# Patient Record
Sex: Female | Born: 1955 | Race: White | Hispanic: No | Smoking: Never smoker
Health system: Southern US, Community
[De-identification: ages and names within clinical notes are randomized; demographics above are authoritative.]

## PROBLEM LIST (undated history)

## (undated) DIAGNOSIS — Z9889 Other specified postprocedural states: Secondary | ICD-10-CM

## (undated) DIAGNOSIS — K8020 Calculus of gallbladder without cholecystitis without obstruction: Secondary | ICD-10-CM

## (undated) DIAGNOSIS — E079 Disorder of thyroid, unspecified: Secondary | ICD-10-CM

## (undated) DIAGNOSIS — E039 Hypothyroidism, unspecified: Secondary | ICD-10-CM

## (undated) DIAGNOSIS — R112 Nausea with vomiting, unspecified: Secondary | ICD-10-CM

## (undated) DIAGNOSIS — I10 Essential (primary) hypertension: Secondary | ICD-10-CM

## (undated) HISTORY — PX: FOOT SURGERY: SHX648

## (undated) HISTORY — PX: CHOLECYSTECTOMY: SHX55

## (undated) HISTORY — PX: OTHER SURGICAL HISTORY: SHX169

## (undated) HISTORY — DX: Essential (primary) hypertension: I10

## (undated) HISTORY — DX: Calculus of gallbladder without cholecystitis without obstruction: K80.20

## (undated) HISTORY — DX: Disorder of thyroid, unspecified: E07.9

---

## 1980-04-22 HISTORY — PX: THYROIDECTOMY, PARTIAL: SHX18

## 1998-05-31 ENCOUNTER — Other Ambulatory Visit: Admission: RE | Admit: 1998-05-31 | Discharge: 1998-05-31 | Payer: Self-pay | Admitting: Obstetrics & Gynecology

## 1999-08-13 ENCOUNTER — Other Ambulatory Visit: Admission: RE | Admit: 1999-08-13 | Discharge: 1999-08-13 | Payer: Self-pay | Admitting: Obstetrics & Gynecology

## 2000-12-24 ENCOUNTER — Other Ambulatory Visit: Admission: RE | Admit: 2000-12-24 | Discharge: 2000-12-24 | Payer: Self-pay | Admitting: Obstetrics & Gynecology

## 2002-03-22 ENCOUNTER — Other Ambulatory Visit: Admission: RE | Admit: 2002-03-22 | Discharge: 2002-03-22 | Payer: Self-pay | Admitting: Obstetrics & Gynecology

## 2002-09-17 ENCOUNTER — Ambulatory Visit (HOSPITAL_COMMUNITY): Admission: RE | Admit: 2002-09-17 | Discharge: 2002-09-17 | Payer: Self-pay | Admitting: *Deleted

## 2002-09-17 ENCOUNTER — Encounter: Payer: Self-pay | Admitting: *Deleted

## 2003-04-20 ENCOUNTER — Other Ambulatory Visit: Admission: RE | Admit: 2003-04-20 | Discharge: 2003-04-20 | Payer: Self-pay | Admitting: Obstetrics & Gynecology

## 2004-07-10 ENCOUNTER — Other Ambulatory Visit: Admission: RE | Admit: 2004-07-10 | Discharge: 2004-07-10 | Payer: Self-pay | Admitting: Obstetrics & Gynecology

## 2010-06-19 ENCOUNTER — Encounter (HOSPITAL_COMMUNITY)
Admission: RE | Admit: 2010-06-19 | Discharge: 2010-06-19 | Disposition: A | Discharge status: Home / Self Care | Payer: Managed Care, Other (non HMO) | Source: Ambulatory Visit | Attending: General Surgery | Admitting: General Surgery

## 2010-06-19 ENCOUNTER — Other Ambulatory Visit (HOSPITAL_COMMUNITY): Payer: Self-pay | Admitting: General Surgery

## 2010-06-19 DIAGNOSIS — I10 Essential (primary) hypertension: Secondary | ICD-10-CM

## 2010-06-19 DIAGNOSIS — Z01812 Encounter for preprocedural laboratory examination: Secondary | ICD-10-CM | POA: Insufficient documentation

## 2010-06-19 DIAGNOSIS — Z01818 Encounter for other preprocedural examination: Secondary | ICD-10-CM | POA: Insufficient documentation

## 2010-06-19 DIAGNOSIS — Z01810 Encounter for preprocedural cardiovascular examination: Secondary | ICD-10-CM | POA: Insufficient documentation

## 2010-06-19 LAB — SURGICAL PCR SCREEN: Staphylococcus aureus: NEGATIVE

## 2010-06-20 ENCOUNTER — Other Ambulatory Visit: Payer: Self-pay | Admitting: General Surgery

## 2010-06-20 ENCOUNTER — Observation Stay (HOSPITAL_COMMUNITY)
Admission: RE | Admit: 2010-06-20 | Discharge: 2010-06-21 | Disposition: A | Discharge status: Home / Self Care | Payer: Managed Care, Other (non HMO) | Source: Ambulatory Visit | Attending: General Surgery | Admitting: General Surgery

## 2010-06-20 ENCOUNTER — Observation Stay (HOSPITAL_COMMUNITY): Payer: Managed Care, Other (non HMO)

## 2010-06-20 DIAGNOSIS — K8020 Calculus of gallbladder without cholecystitis without obstruction: Principal | ICD-10-CM | POA: Insufficient documentation

## 2010-06-20 DIAGNOSIS — E039 Hypothyroidism, unspecified: Secondary | ICD-10-CM | POA: Insufficient documentation

## 2010-06-20 DIAGNOSIS — I10 Essential (primary) hypertension: Secondary | ICD-10-CM | POA: Insufficient documentation

## 2010-07-04 NOTE — Op Note (Signed)
Bonnie Johnson, Bonnie Johnson              ACCOUNT NO.:  [id number]  MEDICAL RECORD NO.:  [id number]           PATIENT TYPE:  I  LOCATION:  5151                         FACILITY:  [facility]  PHYSICIAN:  Angelia Mould. Derrell Lolling, M.D.DATE OF BIRTH:  1955-11-05  DATE OF PROCEDURE:  06/20/2010 DATE OF DISCHARGE:  06/21/2010                              OPERATIVE REPORT   PREOPERATIVE DIAGNOSIS:  Chronic cholecystitis with cholelithiasis.  POSTOPERATIVE DIAGNOSIS:  Chronic cholecystitis with cholelithiasis.  OPERATION PERFORMED:  Laparoscopic cholecystectomy with intraoperative cholangiogram.  SURGEON:  Angelia Mould. Derrell Lolling, MD  FIRST ASSISTANT:  Wilmon Arms. Tsuei, MD  OPERATIVE INDICATIONS:  This is a 55 year old Caucasian female who has had right upper quadrant pain for 2 months.  She did not have any GI problems prior to this.  During this period of time, her appetite had diminished.  She had been nauseated, but no vomiting.  She does not know any triggering factors for the pain.  The pain is worse in the evening, not so band in the morning.  She has had a gallbladder ultrasound which shows several large gallstones but no wall thickening.  Common bile duct measured 3 mm and was felt to be normal.  Her liver function test and CBC are normal.  She has been having almost daily pain according to her. She is brought to the operating room electively.  OPERATIVE FINDINGS:  The gallbladder was discolored and somewhat thick walled and contained numerous palpable stones.  The cholangiogram was normal, showing normal intrahepatic and extrahepatic biliary anatomy, no filling defects, and no obstruction with good flow of contrast into the duodenum.  There was some chronic, soft filmy adhesions in the right lower quadrant, they were fairly extensive.  There did not seem to be any purulence or any primary disease process of the small intestine or large intestine.  The liver, stomach and duodenum looked  normal.  OPERATIVE TECHNIQUE:  Following the induction of general endotracheal anesthesia, the patient's abdomen was prepped and draped in a sterile fashion.  Intravenous antibiotics were given.  Surgical time-out was held, identifying correct patient and correct procedure.  Marcaine 0.25% with epinephrine was used as a local infiltration anesthetic.  A vertically oriented incision was made at the lower rim of the umbilicus. The fascia was incised in the midline and the abdominal cavity entered under direct vision.  An 11-mm Hassan trocar was inserted and secured with purse-string suture of 0 Vicryl.  Pneumoperitoneum was created. Video camera was inserted with visualization and findings as described above.  An 11-mm trocar was placed in the subxiphoid region and two 5 mm trocars were placed in the right upper quadrant.  I took down some of the filmy adhesions in the right lateral paracolic gutter to facilitate the trocar insertion.  The fundus of the gallbladder was grabbed and retracted up over the top of the liver.  I took down some adhesions off the infundibulum of the gallbladder and then we could retract the infundibulum laterally.  I dissected out the cystic duct and the cystic artery.  I isolated the anterior branch of the cystic artery as it went on  to the wall of the gallbladder, secured it with multiple metal clips and divided it.  A c Cook catheter was inserted into cystic duct and a cholangiogram was obtained using the C-arm.  The cholangiogram was normal as described above.  The cholangiogram catheter was removed.  Cystic duct was secured with multiple metal clips and divided.  There was a small posterior branch of the cystic artery which was isolated, secured with multiple metal clips and divided.  The gallbladder was dissected from its bed with electrocautery, placed in a specimen bag and removed.  The operative field was irrigated.  Irrigation of fluid was completely  clear.  There was no bleeding, no bile leak whatsoever.  After evacuating all of the irrigation fluid, the trocars were removed under direct vision.  There was no bleeding from the trocar sites.  The pneumoperitoneum was released.  The fascia at the umbilicus was closed with 0 Vicryl sutures. The skin incisions were closed with 4-0 monocril and dermabond. Sponge, needle and instrument counts were correct.     Angelia Mould. Derrell Lolling, M.D.     HMI/MEDQ  D:  07/02/2010  T:  07/03/2010  Job:  [id number]  cc:   Derek Jack  Electronically Signed by Claud Kelp M.D. on 07/04/2010 09:28:47 AM

## 2010-07-12 ENCOUNTER — Other Ambulatory Visit: Payer: Self-pay | Admitting: General Surgery

## 2010-07-12 DIAGNOSIS — R1011 Right upper quadrant pain: Secondary | ICD-10-CM

## 2010-07-16 ENCOUNTER — Ambulatory Visit
Admission: RE | Admit: 2010-07-16 | Discharge: 2010-07-16 | Disposition: A | Discharge status: Home / Self Care | Payer: Managed Care, Other (non HMO) | Source: Ambulatory Visit | Attending: General Surgery | Admitting: General Surgery

## 2010-07-16 ENCOUNTER — Ambulatory Visit: Admission: RE | Admit: 2010-07-16 | Payer: Managed Care, Other (non HMO) | Source: Ambulatory Visit

## 2010-07-16 ENCOUNTER — Other Ambulatory Visit: Payer: Self-pay | Admitting: General Surgery

## 2010-07-16 DIAGNOSIS — R1011 Right upper quadrant pain: Secondary | ICD-10-CM

## 2010-07-16 MED ORDER — IOHEXOL 300 MG/ML  SOLN
100.0000 mL | Freq: Once | INTRAMUSCULAR | Status: AC | PRN
  Administered 2010-07-16: 100 mL via INTRAVENOUS

## 2010-08-29 ENCOUNTER — Encounter (INDEPENDENT_AMBULATORY_CARE_PROVIDER_SITE_OTHER): Payer: Self-pay | Admitting: General Surgery

## 2011-01-10 ENCOUNTER — Encounter (HOSPITAL_BASED_OUTPATIENT_CLINIC_OR_DEPARTMENT_OTHER): Payer: Self-pay

## 2011-01-10 ENCOUNTER — Emergency Department (INDEPENDENT_AMBULATORY_CARE_PROVIDER_SITE_OTHER): Payer: Managed Care, Other (non HMO)

## 2011-01-10 ENCOUNTER — Emergency Department (HOSPITAL_BASED_OUTPATIENT_CLINIC_OR_DEPARTMENT_OTHER)
Admission: EM | Admit: 2011-01-10 | Discharge: 2011-01-10 | Disposition: A | Discharge status: Home / Self Care | Payer: Managed Care, Other (non HMO) | Attending: Emergency Medicine | Admitting: Emergency Medicine

## 2011-01-10 DIAGNOSIS — W268XXA Contact with other sharp object(s), not elsewhere classified, initial encounter: Secondary | ICD-10-CM | POA: Insufficient documentation

## 2011-01-10 DIAGNOSIS — S61215A Laceration without foreign body of left ring finger without damage to nail, initial encounter: Secondary | ICD-10-CM

## 2011-01-10 DIAGNOSIS — S61209A Unspecified open wound of unspecified finger without damage to nail, initial encounter: Secondary | ICD-10-CM | POA: Insufficient documentation

## 2011-01-10 DIAGNOSIS — S56129A Laceration of flexor muscle, fascia and tendon of unspecified finger at forearm level, initial encounter: Secondary | ICD-10-CM

## 2011-01-10 DIAGNOSIS — X58XXXA Exposure to other specified factors, initial encounter: Secondary | ICD-10-CM

## 2011-01-10 DIAGNOSIS — E079 Disorder of thyroid, unspecified: Secondary | ICD-10-CM | POA: Insufficient documentation

## 2011-01-10 DIAGNOSIS — I10 Essential (primary) hypertension: Secondary | ICD-10-CM | POA: Insufficient documentation

## 2011-01-10 MED ORDER — TETANUS-DIPHTH-ACELL PERTUSSIS 5-2-15.5 LF-MCG/0.5 IM SUSP: 0.5000 mL | Freq: Once | INTRAMUSCULAR | Status: DC

## 2011-01-10 MED ORDER — LIDOCAINE-EPINEPHRINE 2 %-1:100000 IJ SOLN
INTRAMUSCULAR | Status: AC
  Administered 2011-01-10: 1 mL
  Filled 2011-01-10: qty 1

## 2011-01-10 MED ORDER — TETANUS-DIPHTH-ACELL PERTUSSIS 5-2.5-18.5 LF-MCG/0.5 IM SUSP
INTRAMUSCULAR | Status: AC
  Administered 2011-01-10: 0.5 mL via INTRAMUSCULAR
  Filled 2011-01-10: qty 0.5

## 2011-01-10 NOTE — ED Notes (Signed)
Laceration to 5th digit.  Injury occurred while opening a dog food can.

## 2011-01-10 NOTE — ED Provider Notes (Signed)
History     CSN: [id number] Arrival date & time: 01/10/2011  8:23 AM   Chief Complaint  Patient presents with  . Laceration     (Include location/radiation/quality/duration/timing/severity/associated sxs/prior treatment) Patient is a 55 y.o. female presenting with skin laceration. The history is provided by the patient.  Laceration  The incident occurred less than 1 hour ago. Pain location: left little and ring finger. The laceration is 3 cm in size. The laceration mechanism was a a metal edge. The pain is moderate. The pain has been constant since onset. She reports no foreign bodies present. Her tetanus status is out of date.     Past Medical History  Diagnosis Date  . Thyroid disease   . Hypertension      Past Surgical History  Procedure Date  . Thyroidectomy, partial 1982  . Foot surgery Prior to 1982    3 surgeries to remove cyst  . Cholecystectomy     Family History  Problem Relation Age of Onset  . Heart disease Father     History  Substance Use Topics  . Smoking status: Never Smoker   . Smokeless tobacco: Never Used  . Alcohol Use: No    OB History    Grav Para Term Preterm Abortions TAB SAB Ect Mult Living                  Review of Systems  All other systems reviewed and are negative.    Allergies  Avelox; Codeine; Lisinopril; Sulfa antibiotics; and Penicillins  Home Medications   Current Outpatient Rx  Name Route Sig Dispense Refill  . CANDESARTAN CILEXETIL 8 MG PO TABS Oral Take 8 mg by mouth daily.      Marland Kitchen FELODIPINE 5 MG PO TB24 Oral Take 5 mg by mouth daily.      Marland Kitchen LEVOTHYROXINE SODIUM 125 MCG PO TABS Oral Take 125 mcg by mouth daily.        Physical Exam    BP 133/68  Pulse 80  Temp(Src) 97.6 F (36.4 C) (Oral)  Resp 16  Ht 5\' 4"  (1.626 m)  Wt 120 lb (54.432 kg)  BMI 20.60 kg/m2  SpO2 100%  Physical Exam  Constitutional: She is oriented to person, place, and time. She appears well-developed and well-nourished.  HENT:    Head: Normocephalic.  Eyes: EOM are normal.  Neck: Normal range of motion.  Pulmonary/Chest: Effort normal.  Musculoskeletal: Normal range of motion.       Superficial laceration over volar surface of proximal phalynx of left ring finger with normal flexor and extensor tendon function as well as neurovascular status.  Laceration over the radial surface of the proximal phalanx of the left little finger which is deep in nature.  There does appear to be a small amount of bleeding from this area.  The patient has no flexor tendon function, but normal extensor tendon function. She has numbness of the radial aspect of the left little finger  Neurological: She is alert and oriented to person, place, and time.  Psychiatric: She has a normal mood and affect.    ED Course  LACERATION REPAIR Date/Time: 01/10/2011 9:16 AM Performed by: Lyanne Co Authorized by: Lyanne Co Consent: Verbal consent obtained. Risks and benefits: risks, benefits and alternatives were discussed Consent given by: patient Patient identity confirmed: verbally with patient Body area: upper extremity Location details: left ring finger Laceration length: 1 cm Foreign bodies: no foreign bodies Tendon involvement: none Nerve involvement: none Vascular  damage: no Anesthesia: local infiltration Local anesthetic: lidocaine 2% without epinephrine Anesthetic total: 1 ml Preparation: Patient was prepped and draped in the usual sterile fashion. Irrigation solution: saline Irrigation method: syringe Amount of cleaning: standard Skin closure: 4-0 Prolene Number of sutures: 2 Technique: simple Approximation: close Approximation difficulty: simple Dressing: gauze roll Patient tolerance: Patient tolerated the procedure well with no immediate complications.  LACERATION REPAIR Date/Time: 01/10/2011 9:17 AM Performed by: Lyanne Co Authorized by: Lyanne Co Consent: Verbal consent obtained. Consent given by:  patient Patient identity confirmed: verbally with patient Body area: upper extremity Location details: left small finger Laceration length: 2 cm Tendon involvement: complex Nerve involvement: complex Vascular damage: yes Local anesthetic: lidocaine 2% without epinephrine Anesthetic total: 3 ml Irrigation solution: saline Irrigation method: syringe Amount of cleaning: standard Skin closure: 4-0 Prolene Number of sutures: 3 Technique: simple Approximation: close Approximation difficulty: simple Dressing: gauze roll Patient tolerance: Patient tolerated the procedure well with no immediate complications.    Results for orders placed during the hospital encounter of 06/19/10  SURGICAL PCR SCREEN      Component Value Range   MRSA, PCR NEGATIVE  NEGATIVE    Staphylococcus aureus    NEGATIVE    Value: NEGATIVE            The Xpert SA Assay (FDA     approved for NASAL specimens     only), is one component of     a comprehensive surveillance     program.  It is not intended     to diagnose infection nor to     guide or monitor treatment.   Dg Hand Complete Left  01/10/2011  *RADIOLOGY REPORT*  Clinical Data: Small finger laceration.  LEFT HAND - COMPLETE 3+ VIEW  Comparison: None.  Findings: There is soft tissue irregularity of the small finger proximally consistent with a laceration.  No foreign body is identified.  There is no evidence of acute fracture or dislocation.  IMPRESSION: Soft tissue laceration without demonstrated foreign body or acute osseous abnormality.  Original Report Authenticated By: Gerrianne Scale, M.D.   I personally reviewed the radiographs from today's visit   MDM Superficial laceration of left ring was repaired primarily at the bedside.  Patient with evidence of flexor tendon injury to her left little finger as well as likely damage to the neurovascular bundle on the radial aspect.  Awaiting callback from hand surgery at this time for further evaluation.   The left little finger wound was closed loosely with 3 Prolene sutures.  Tetanus updated.  9:33 AM Spoke with Dr Ophelia Charter who will see the pt in the office later today and likely repair in the OR tomorrow. Pt informed. Dr Ophelia Charter office will contact the pt today with her appointment details       Lyanne Co, MD 01/10/11 [phone]

## 2011-01-10 NOTE — ED Notes (Signed)
Pt ambulatory to radiology

## 2011-01-10 NOTE — ED Notes (Signed)
Suturing complete and pt tolerated well.  Dressing to affected fingers applied by Mauricio Po, EMT.

## 2011-01-10 NOTE — ED Notes (Signed)
MD at bedside suturing

## 2011-01-11 ENCOUNTER — Encounter (HOSPITAL_COMMUNITY)
Admission: RE | Admit: 2011-01-11 | Discharge: 2011-01-11 | Disposition: A | Discharge status: Home / Self Care | Payer: Managed Care, Other (non HMO) | Source: Ambulatory Visit | Attending: Orthopaedic Surgery | Admitting: Orthopaedic Surgery

## 2011-01-11 LAB — CBC
Platelets: 230 10*3/uL (ref 150–400)
RBC: 4.75 MIL/uL (ref 3.87–5.11)
RDW: 13.3 % (ref 11.5–15.5)
WBC: 8.9 10*3/uL (ref 4.0–10.5)

## 2011-01-11 LAB — BASIC METABOLIC PANEL
Calcium: 9.8 mg/dL (ref 8.4–10.5)
GFR calc Af Amer: >60 mL/min (ref >60)
GFR calc non Af Amer: >60 mL/min (ref >60)
Sodium: 143 mEq/L (ref 135–145)

## 2011-01-11 LAB — PROTIME-INR
INR: 0.97 (ref 0.00–1.49)
Prothrombin Time: 13.1 seconds (ref 11.6–15.2)

## 2011-01-11 LAB — SURGICAL PCR SCREEN
MRSA, PCR: NEGATIVE
Staphylococcus aureus: NEGATIVE

## 2011-01-14 ENCOUNTER — Ambulatory Visit (HOSPITAL_COMMUNITY)
Admission: RE | Admit: 2011-01-14 | Discharge: 2011-01-14 | Disposition: A | Discharge status: Home / Self Care | Payer: Managed Care, Other (non HMO) | Source: Ambulatory Visit | Attending: Orthopaedic Surgery | Admitting: Orthopaedic Surgery

## 2011-01-14 DIAGNOSIS — Y929 Unspecified place or not applicable: Secondary | ICD-10-CM | POA: Insufficient documentation

## 2011-01-14 DIAGNOSIS — Z01812 Encounter for preprocedural laboratory examination: Secondary | ICD-10-CM | POA: Insufficient documentation

## 2011-01-14 DIAGNOSIS — S5420XA Injury of radial nerve at forearm level, unspecified arm, initial encounter: Secondary | ICD-10-CM | POA: Insufficient documentation

## 2011-01-14 DIAGNOSIS — S61209A Unspecified open wound of unspecified finger without damage to nail, initial encounter: Secondary | ICD-10-CM | POA: Insufficient documentation

## 2011-01-14 DIAGNOSIS — X58XXXA Exposure to other specified factors, initial encounter: Secondary | ICD-10-CM | POA: Insufficient documentation

## 2011-01-22 NOTE — Op Note (Signed)
NAMEZAMYAH, Bonnie Johnson              ACCOUNT NO.:  [id number]  MEDICAL RECORD NO.:  [id number]  LOCATION:                                 FACILITY:  PHYSICIAN:  Mark C. Ophelia Charter, M.D.    DATE OF BIRTH:  08-20-1955  DATE OF PROCEDURE:  01/14/2011 DATE OF DISCHARGE:                              OPERATIVE REPORT   PREOPERATIVE DIAGNOSIS:  Left hand 44-day-old laceration closed with small finger flexor digitorum profundus zone 2 injury and lacerated radial digital nerve.  POSTOPERATIVE DIAGNOSIS:  Left hand 13-day-old laceration closed with small finger flexor digitorum profundus zone 2 injury and lacerated radial digital nerve.  PROCEDURE:  Expiration, right fifth finger laceration repair profundus tendon and microscopic radial digital nerve repair.  SURGEON:  Mark C. Ophelia Charter, MD  ANESTHESIA:  General plus ulnar nerve block at the conclusion of the case at the elbow, 5 mL Marcaine.  TOURNIQUET TIME:  An hour and 30 minutes.  ESTIMATED BLOOD LOSS:  Minimal.  After induction of general anesthesia, preoperative Ancef prophylaxis, standard prepping and draping.  The usual extremity sheets drapes were applied.  Time-out procedure was completed.  The patient had a smiley- faced laceration along the palmar aspect of the base of the ring finger that extended to the radial aspect.  Sutures removed.  Copious irrigation was performed.  Incision was extended proximally and distally, with this exact incision that extended to the distal palmar crease since the flexor tendon was lacerated and had pulled back. Radial digital nerve was torn.  Digital artery had been cut, but did thrombose and was not bleeding.  Flexor sheath was inspected once the slip of the sublimis was intact which was the ulnar submersed in the radial slip of the sublimis had a complete laceration.  DIP joint was flexed, 4-0 Ethibond suture was placed in the Kessler modification of a Tajima suture using a double-armed needle.   Initially, a figure-of-eight suture was placed in the slip of the sublimis, however, the incision was extended proximally, the profundus was found proximal to A1 pulley, A1 pulley was divided and suture placed in the proximal portion of the tendon using the tendon passer was passed through the sheath, however, with the tendon running through the sheath it was tight and single slip of the sublimis had been repaired, it was taken down taken to suture out.  Two ends of the suture for the profundus were tied down, suture tied down on one end, attention was held by Maud Deed, PA, who was present for assessing the surgical case and was medically necessary for assistance in the procedure.  Tendon was tied down and then a running 6- 0 nylon suture was run across the volar aspect of the tendon repair to smooth the repair.  Next, the operative microscope was draped and brought in.  Using an 8-0 nylon, three sutures were placed and time spent repairing the ulnar digital nerve at the neural repair.  Wrist was flexed and extended passively and there was a normal cascade of the digits.  Suture skin flaps had been placed with 4-0 nylon were then removed.  Tourniquet was deflated.  Bipolar was used for appropriate skin bleeding edges.  A 4-0 nylon was used for interrupted skin closure.  Xeroform, 4x4s, twiners and the flexor tendon splint with the wrist flexed, MCPs flexed, and PIPs, DIPs with fluffs in the palm were then applied.  At the conclusion of the case, since the regional cut had been a C-shaped cut with a distally based flap, rather than putting local in the skin, it was elected to perform ulnar nerve block at the elbow.  It was flexed up to 90 degrees, palpation of the medial epicondyle and olecranon and then using a 25-needle 5 mL of Marcaine was infiltrated into the ulnar groove and the cubital tunnel for the nerve block and massaged.  After old fluffs had been applied to the hand,  4x4s, Webril dorsal plaster splint and then incorporation of the volar, but no plaster in the palm or fingers.  The patient tolerated the procedure well, was transferred to the recovery room in stable condition.  Instrument count and needle count was correct.  There was good capillary refill to the digit.     Mark C. Ophelia Charter, M.D.     MCY/MEDQ  D:  01/14/2011  T:  01/15/2011  Job:  [id number]  Electronically Signed by Annell Greening M.D. on 01/22/2011 05:23:12 PM

## 2011-10-02 ENCOUNTER — Encounter (HOSPITAL_BASED_OUTPATIENT_CLINIC_OR_DEPARTMENT_OTHER)
Admission: RE | Admit: 2011-10-02 | Discharge: 2011-10-02 | Disposition: A | Discharge status: Home / Self Care | Payer: BC Managed Care – PPO | Source: Ambulatory Visit | Attending: Orthopedic Surgery | Admitting: Orthopedic Surgery

## 2011-10-02 ENCOUNTER — Encounter (HOSPITAL_BASED_OUTPATIENT_CLINIC_OR_DEPARTMENT_OTHER): Payer: Self-pay | Admitting: *Deleted

## 2011-10-02 LAB — BASIC METABOLIC PANEL
CO2: 28 mEq/L (ref 19–32)
Calcium: 8.9 mg/dL (ref 8.4–10.5)
Creatinine, Ser: 0.62 mg/dL (ref 0.50–1.10)
GFR calc Af Amer: >90 mL/min (ref >90)
GFR calc non Af Amer: >90 mL/min (ref >90)

## 2011-10-02 NOTE — Progress Notes (Signed)
Bring all medications and pack an overnight bag in case neighbor's cannot take care of her. Plans to come today for BMET and EKG.

## 2011-10-03 ENCOUNTER — Other Ambulatory Visit: Payer: Self-pay | Admitting: Orthopedic Surgery

## 2011-10-07 NOTE — H&P (Signed)
Bonnie Johnson is an 56 y.o. female.   Chief Complaint: c/o continued pain and stiffness digits of her left hand HPI: .  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  While opening up a dog food can with an electric can opener on January 10, 2011 she sustained a deep laceration to the P-1 segment of her left small finger and a superficial laceration to her left ring finger.  She was seen at [facility] where she was noted to have laceration of the radial proper digital nerve and artery of the small finger, partial laceration of the flexor digitorum superficialis.  On January 14, 2011 she underwent exploration of her small finger laceration with repair of the radial proper digital nerve and repair of her profundus tendon zone 2.  She underwent extensive hand rehab at [facility] and has developed flexion to bring her fingertip to the palm, but has a progressive flexion contracture developing at the ring finger MP joint and at the small finger MP and PIP joint.  She has a thick scar on the palmar surface of her small finger and marked sensitivity over her nerve repair site.   Past Medical History  Diagnosis Date  . Thyroid disease   . Hypertension   . Hypothyroidism   . PONV (postoperative nausea and vomiting)     Past Surgical History  Procedure Date  . Thyroidectomy, partial 1982  . Foot surgery Prior to 1982    3 surgeries to remove cyst  . Surgery on left hand     sept 24th 2012, severe pinky finger on can lid  . Cholecystectomy     2012    Family History  Problem Relation Age of Onset  . Heart disease Father    Social History:  reports that she has never smoked. She has never used smokeless tobacco. She reports that she drinks alcohol. She reports that she does not use illicit drugs.  Allergies:  Allergies  Allergen Reactions  . Avelox (Moxifloxacin Hcl In Nacl)   . Codeine   .  Doxycycline Hives  . Lisinopril   . Sulfa Antibiotics   . Penicillins Rash    No prescriptions prior to admission    No results found for this or any previous visit (from the past 48 hour(s)).  No results found.   Pertinent items are noted in HPI.  Height 4' (1.219 m), weight 54.432 kg (120 lb).  General appearance: alert Head: Normocephalic, without obvious abnormality Neck: supple, symmetrical, trachea midline Resp: clear to auscultation bilaterally Cardio: regular rate and rhythm GI: normal findings: aorta normal Extremities:.  Inspection of her hand reveals a well healed surgical scar. 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 She has a significant nodule of Dupuytren's fibromatosis forming over the P-1 segment of her small finger which is contributing to her PIP flexion contracture. She has pull through of her flexor profundus repair primarily leading to flexion at the PIP joint.  She has minimal pull through at the DIP joint.  She has a positive Tinel's sign at her repair site and diminished sensibility in the radial pulp of her small finger, normal sensibility on the ulnar pulp of the small finger.  She has a mildly hypertrophic scar at her ring finger overlying  the P-1 segment radial aspect of her scar and normal function of her profundus superficialis tendons of the ring finger.   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  Bonnie Johnson has been advised as to the extreme risk involved in a re exploration of a traumatic finger injury where the digital  artery was not repaired specifically the dominant radial proper digital artery of the small finger.  With Dupuytrens superimposed upon trauma scarring, there is a very real risk of finger loss.  Indeed, primary amputation of this finger has been discussed and considered.  We proceed on a best effort basis fully aware of the extreme risk in this procedure.   Plan: To the OR for excision of palmar fascia with extensive tenolysis of flexors left hand. The procedure, risks and post-op course were discussed with the patient at length and she was in agreement with the plan.  DASNOIT,Almas Rake J 10/07/2011, 1:49 PM

## 2011-10-08 ENCOUNTER — Encounter (HOSPITAL_BASED_OUTPATIENT_CLINIC_OR_DEPARTMENT_OTHER): Payer: Self-pay | Admitting: Anesthesiology

## 2011-10-08 ENCOUNTER — Ambulatory Visit (HOSPITAL_BASED_OUTPATIENT_CLINIC_OR_DEPARTMENT_OTHER): Payer: BC Managed Care – PPO | Admitting: Anesthesiology

## 2011-10-08 ENCOUNTER — Encounter (HOSPITAL_BASED_OUTPATIENT_CLINIC_OR_DEPARTMENT_OTHER)
Admission: RE | Disposition: A | Discharge status: Home / Self Care | Payer: Self-pay | Source: Ambulatory Visit | Attending: Orthopedic Surgery

## 2011-10-08 ENCOUNTER — Ambulatory Visit (HOSPITAL_BASED_OUTPATIENT_CLINIC_OR_DEPARTMENT_OTHER)
Admission: RE | Admit: 2011-10-08 | Discharge: 2011-10-08 | Disposition: A | Discharge status: Home / Self Care | Payer: BC Managed Care – PPO | Source: Ambulatory Visit | Attending: Orthopedic Surgery | Admitting: Orthopedic Surgery

## 2011-10-08 DIAGNOSIS — X58XXXS Exposure to other specified factors, sequela: Secondary | ICD-10-CM | POA: Insufficient documentation

## 2011-10-08 DIAGNOSIS — IMO0002 Reserved for concepts with insufficient information to code with codable children: Secondary | ICD-10-CM | POA: Insufficient documentation

## 2011-10-08 DIAGNOSIS — I10 Essential (primary) hypertension: Secondary | ICD-10-CM | POA: Insufficient documentation

## 2011-10-08 DIAGNOSIS — M720 Palmar fascial fibromatosis [Dupuytren]: Secondary | ICD-10-CM | POA: Insufficient documentation

## 2011-10-08 DIAGNOSIS — M24549 Contracture, unspecified hand: Secondary | ICD-10-CM | POA: Insufficient documentation

## 2011-10-08 DIAGNOSIS — Z01810 Encounter for preprocedural cardiovascular examination: Secondary | ICD-10-CM | POA: Insufficient documentation

## 2011-10-08 DIAGNOSIS — E039 Hypothyroidism, unspecified: Secondary | ICD-10-CM | POA: Insufficient documentation

## 2011-10-08 HISTORY — PX: DUPUYTREN CONTRACTURE RELEASE: SHX1478

## 2011-10-08 HISTORY — DX: Hypothyroidism, unspecified: E03.9

## 2011-10-08 HISTORY — DX: Other specified postprocedural states: Z98.890

## 2011-10-08 HISTORY — DX: Nausea with vomiting, unspecified: R11.2

## 2011-10-08 SURGERY — RELEASE, DUPUYTREN CONTRACTURE
Anesthesia: General | Site: Hand | Laterality: Left | Wound class: Clean

## 2011-10-08 MED ORDER — ONDANSETRON HCL 4 MG/2ML IJ SOLN
INTRAMUSCULAR | Status: DC | PRN
  Administered 2011-10-08: 4 mg via INTRAVENOUS

## 2011-10-08 MED ORDER — FENTANYL CITRATE 0.05 MG/ML IJ SOLN
INTRAMUSCULAR | Status: DC | PRN
  Administered 2011-10-08 (×3): 25 ug via INTRAVENOUS

## 2011-10-08 MED ORDER — PROPOFOL 10 MG/ML IV EMUL
INTRAVENOUS | Status: DC | PRN
  Administered 2011-10-08: 150 mg via INTRAVENOUS

## 2011-10-08 MED ORDER — DEXAMETHASONE SODIUM PHOSPHATE 4 MG/ML IJ SOLN
INTRAMUSCULAR | Status: DC | PRN
  Administered 2011-10-08: 10 mg via INTRAVENOUS

## 2011-10-08 MED ORDER — HYDROMORPHONE HCL 2 MG PO TABS: ORAL_TABLET | ORAL | Status: AC

## 2011-10-08 MED ORDER — LIDOCAINE HCL 2 % IJ SOLN
INTRAMUSCULAR | Status: DC | PRN
  Administered 2011-10-08: 3 mL

## 2011-10-08 MED ORDER — CHLORHEXIDINE GLUCONATE 4 % EX LIQD: 60.0000 mL | Freq: Once | CUTANEOUS | Status: DC

## 2011-10-08 MED ORDER — LIDOCAINE HCL (CARDIAC) 20 MG/ML IV SOLN
INTRAVENOUS | Status: DC | PRN
  Administered 2011-10-08: 100 mg via INTRAVENOUS

## 2011-10-08 MED ORDER — FENTANYL CITRATE 0.05 MG/ML IJ SOLN
25.0000 ug | INTRAMUSCULAR | Status: DC | PRN
  Administered 2011-10-08: 50 ug via INTRAVENOUS

## 2011-10-08 MED ORDER — ONDANSETRON HCL 4 MG/2ML IJ SOLN
4.0000 mg | Freq: Four times a day (QID) | INTRAMUSCULAR | Status: AC | PRN
  Administered 2011-10-08: 4 mg via INTRAVENOUS

## 2011-10-08 MED ORDER — VANCOMYCIN HCL IN DEXTROSE 1-5 GM/200ML-% IV SOLN
1000.0000 mg | INTRAVENOUS | Status: AC
  Administered 2011-10-08: 1000 mg via INTRAVENOUS

## 2011-10-08 MED ORDER — DROPERIDOL 2.5 MG/ML IJ SOLN
0.6250 mg | Freq: Once | INTRAMUSCULAR | Status: AC
  Administered 2011-10-08: 0.625 mg via INTRAVENOUS

## 2011-10-08 MED ORDER — PROMETHAZINE HCL 25 MG/ML IJ SOLN
6.2500 mg | Freq: Once | INTRAMUSCULAR | Status: AC
  Administered 2011-10-08: 6.25 mg via INTRAVENOUS

## 2011-10-08 MED ORDER — LACTATED RINGERS IV SOLN
INTRAVENOUS | Status: DC
  Administered 2011-10-08: 20 mL/h via INTRAVENOUS
  Administered 2011-10-08 (×2): via INTRAVENOUS

## 2011-10-08 MED ORDER — DROPERIDOL 2.5 MG/ML IJ SOLN: 0.6250 mg | Freq: Once | INTRAMUSCULAR | Status: DC

## 2011-10-08 MED ORDER — SCOPOLAMINE 1 MG/3DAYS TD PT72
1.0000 | MEDICATED_PATCH | TRANSDERMAL | Status: DC
  Administered 2011-10-08: 1.5 mg via TRANSDERMAL

## 2011-10-08 MED ORDER — IBUPROFEN 600 MG PO TABS: 600.0000 mg | ORAL_TABLET | Freq: Four times a day (QID) | ORAL | Status: AC | PRN

## 2011-10-08 MED ORDER — DROPERIDOL 2.5 MG/ML IJ SOLN: 0.6250 mg | Freq: Once | INTRAMUSCULAR | Status: DC | PRN

## 2011-10-08 SURGICAL SUPPLY — 52 items
BANDAGE ADHESIVE 1X3 (GAUZE/BANDAGES/DRESSINGS) IMPLANT
BANDAGE CONFORM 3  STR LF (GAUZE/BANDAGES/DRESSINGS) IMPLANT
BANDAGE ELASTIC 3 VELCRO ST LF (GAUZE/BANDAGES/DRESSINGS) ×2 IMPLANT
BANDAGE GAUZE ELAST BULKY 4 IN (GAUZE/BANDAGES/DRESSINGS) ×4 IMPLANT
BLADE MINI RND TIP GREEN BEAV (BLADE) IMPLANT
BLADE SURG 15 STRL LF DISP TIS (BLADE) ×1 IMPLANT
BLADE SURG 15 STRL SS (BLADE) ×2
BNDG CMPR 9X4 STRL LF SNTH (GAUZE/BANDAGES/DRESSINGS) ×1
BNDG ESMARK 4X9 LF (GAUZE/BANDAGES/DRESSINGS) ×1 IMPLANT
BRUSH SCRUB EZ PLAIN DRY (MISCELLANEOUS) ×3 IMPLANT
CLOTH BEACON ORANGE TIMEOUT ST (SAFETY) ×2 IMPLANT
CORDS BIPOLAR (ELECTRODE) ×2 IMPLANT
COVER MAYO STAND STRL (DRAPES) ×2 IMPLANT
COVER TABLE BACK 60X90 (DRAPES) ×2 IMPLANT
CUFF TOURNIQUET SINGLE 18IN (TOURNIQUET CUFF) ×1 IMPLANT
DECANTER SPIKE VIAL GLASS SM (MISCELLANEOUS) IMPLANT
DRAPE EXTREMITY T 121X128X90 (DRAPE) ×2 IMPLANT
DRAPE SURG 17X23 STRL (DRAPES) ×2 IMPLANT
DRSG EMULSION OIL 3X3 NADH (GAUZE/BANDAGES/DRESSINGS) ×3 IMPLANT
GLOVE BIO SURGEON STRL SZ 6.5 (GLOVE) ×1 IMPLANT
GLOVE BIO SURGEON STRL SZ7 (GLOVE) ×3 IMPLANT
GLOVE BIOGEL M STRL SZ7.5 (GLOVE) ×2 IMPLANT
GLOVE INDICATOR 7.0 STRL GRN (GLOVE) ×2 IMPLANT
GLOVE ORTHO TXT STRL SZ7.5 (GLOVE) ×2 IMPLANT
GOWN PREVENTION PLUS XLARGE (GOWN DISPOSABLE) ×2 IMPLANT
GOWN PREVENTION PLUS XXLARGE (GOWN DISPOSABLE) ×4 IMPLANT
LOOP VESSEL MAXI BLUE (MISCELLANEOUS) ×1 IMPLANT
NDL HYPO 25X1 1.5 SAFETY (NEEDLE) IMPLANT
NEEDLE 27GAX1X1/2 (NEEDLE) ×1 IMPLANT
NEEDLE HYPO 25X1 1.5 SAFETY (NEEDLE) IMPLANT
NS IRRIG 1000ML POUR BTL (IV SOLUTION) ×2 IMPLANT
PACK BASIN DAY SURGERY FS (CUSTOM PROCEDURE TRAY) ×2 IMPLANT
PAD CAST 3X4 CTTN HI CHSV (CAST SUPPLIES) ×1 IMPLANT
PADDING CAST ABS 4INX4YD NS (CAST SUPPLIES)
PADDING CAST ABS COTTON 4X4 ST (CAST SUPPLIES) ×1 IMPLANT
PADDING CAST COTTON 3X4 STRL (CAST SUPPLIES) ×2
SPLINT PLASTER CAST XFAST 3X15 (CAST SUPPLIES) ×4 IMPLANT
SPLINT PLASTER XTRA FASTSET 3X (CAST SUPPLIES) ×4
SPONGE GAUZE 4X4 12PLY (GAUZE/BANDAGES/DRESSINGS) ×2 IMPLANT
STOCKINETTE 4X48 STRL (DRAPES) ×2 IMPLANT
STRIP CLOSURE SKIN 1/2X4 (GAUZE/BANDAGES/DRESSINGS) IMPLANT
SUT ETHILON 5 0 P 3 18 (SUTURE) ×1
SUT NYLON ETHILON 5-0 P-3 1X18 (SUTURE) ×2 IMPLANT
SUT SILK 4 0 PS 2 (SUTURE) ×2 IMPLANT
SUT VIC AB 4-0 P2 18 (SUTURE) IMPLANT
SYR 3ML 23GX1 SAFETY (SYRINGE) IMPLANT
SYR BULB 3OZ (MISCELLANEOUS) ×1 IMPLANT
SYR CONTROL 10ML LL (SYRINGE) ×1 IMPLANT
TOWEL OR 17X24 6PK STRL BLUE (TOWEL DISPOSABLE) ×4 IMPLANT
TRAY DSU PREP LF (CUSTOM PROCEDURE TRAY) ×3 IMPLANT
UNDERPAD 30X30 INCONTINENT (UNDERPADS AND DIAPERS) ×2 IMPLANT
WATER STERILE IRR 1000ML POUR (IV SOLUTION) ×2 IMPLANT

## 2011-10-08 NOTE — Op Note (Signed)
[name], [name]              ACCOUNT NO.:  [id number]  MEDICAL RECORD NO.:  [id number]  LOCATION:                                 FACILITY:  PHYSICIAN:  Bonnie Fitch. Jaxtin Raimondo, M.D.      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  OPERATION: 1. Neurolysis and identification of ulnar proper digital artery and     nerve. 2. 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 3. Tenolysis of flexor digitorum profundus tendon. 4. Tenolysis of flexor digitorum superficialis tendon. 5. Neurolysis of radial proper digital nerve with confirmation of     absence of radial proper digital artery.  OPERATING SURGEON:  Bonnie Fitch. Courtnie Brenes, MD  ASSISTANT:  Marveen Reeks. Dasnoit, PA-C.  ANESTHESIA:  General by LMA supplemented by a postoperative 2% lidocaine digital block.  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  On January 10, 2011, she sustained deep laceration to P1 segment of her left small finger and a superficial laceration to her left ring finger.  She was seen at [facility] and underwent operative treatment by Dr. Annell Greening with repair of the flexor digitorum profundus tendon zone 2 at the level of the distal margin of the A2 pulley and repair of the radial proper digital nerve.  The radial proper digital artery was not repaired.  In the early postoperative period, she began to develop a flexion contracture of her PIP joint and within a few months progressed to nearly 90 degree flexion contracture of the PIP joint.  She was unable to further flex the finger despite extensive therapy by hand rehabilitation specialist.  On Aug 26, 2011, she was referred for upper extremity orthopedic second opinion.  At that time, we reviewed her operative records in detail.  We identified the fact that she did not have radial proper digital artery to the small finger and may have a partially ischemic finger.  She had marked impairment of the sensibility of the radial pulp of the small finger.  She had intact sensibility to the ulnar pulp.  She appeared to have on Allen's testing flow through the ulnar proper digital artery.  We had detailed  informed consent with Bonnie Johnson explaining how complex this predicament was.  She appeared to have a flexion contracture at the PIP and DIP joints due to activation of palmar fibromatosis following a trauma.  There are also issues with whether or not she has a robust blood supply to the finger after losing the radial proper digital artery.  In addition, she may have bowstringing due to incompetence of the flexor retinaculum, specifically the distal portion of the A2 pulley, C1 pulley, the A3 pulley, and possibly the C2 pulley.  With the degree of  flexion contracture at the time of our initial consult, it was not possible to ascertain the integrity of her flexor sheath.  We had a detailed informed consent during which I explained that several outcomes could occur with attempt to correct this finger.  She is in great jeopardy to have an ischemic finger and could have vascular spasm in the postop period leading to gangrene and loss of the finger.  In addition, it is entirely possible that she will develop posttraumatic palmar fibromatosis and have recurrent flexion contracture of the PIP and DIP joints and may have incompetence of the flexor sheath that will make recovery of functional motion very challenging.  We discussed alternative treatments which would include arthrodesis of the proximal interphalangeal joint to salvage the finger versus shortening or deletion of the finger should she have ischemia or a severe flexion contracture recur.  She voiced a complete understanding of these issues.  She now presents for operative care.  In the holding area, we had a second informed consent at which time we reminded her with a friend present that all of these issues were still active and noted that her flexion contracture at the PIP joint had advanced to more than 90 degrees and at the DIP joint approximately 45 degrees.  After informed consent, she is brought to the operating room at this time.  PROCEDURE:  Bonnie Johnson was brought to room #2 of the [facility] and placed in supine position upon the operating table.  Following anesthesia consultation by Dr. Chaney Malling, general anesthesia by LMA technique was recommended and accepted.  Bonnie Johnson has multiple drug allergies.  After carefully starting them, we determined that she was a proper candidate for prophylactic IV vancomycin.  One gram was provided in the holding area.  This would be infused over 90 minutes.  In room #2 under Dr. Seward Meth strict  supervision, general anesthesia by LMA technique was induced followed by routine Betadine scrub and paint of the left upper extremity.  A pneumatic tourniquet was applied to proximal left brachium. Following exsanguination of left arm with Esmarch bandage, arterial tourniquet inflated to 220 mmHg.  Procedure commenced with routine surgical time-out followed by meticulous resection of the prior surgical scar and extension proximally with a Brunner zig zag incision exposing proximal to the A1 pulley.  Abundant scar was identified in the pretendinous fibers as well as a large amount of scar at the natatory ligaments at the web to the ring finger.  With great care, we meticulously dissected the ulnar proper digital artery and nerve from the level of metacarpal head distally releasing Grayson's ligaments and preserving Cleland's ligaments.  There was a thick band of pathologic fascia extending from the abductor digiti minimi that was carefully resected after isolation of the neurovascular structures.  Once the ulnar proper digital nerve and artery were carefully isolated, we then proceeded to release the skin at the central aspect of the  finger as well as on the radial aspect of the finger.  The radial proper digital nerve was identified at the level of metacarpal head and dissected distally.  The nerve repair site was identified with fine suture and the atretic radial proper digital artery was identified as scarred and nonfunctional.  The proximal portion of the A2 pulley was competent.  The distal portion was incompetent.  The C1 pulley, A3 pulley, and C2 pulley were incompetent with bowstringing of the flexor repair.  The flexor tendon was densely adherent to the proximal phalanx and middle phalanx requiring extensive tenolysis with scissors dissection, use of a Glorious Peach, Malcolm and other specialized tools including a fine Allis clamp.  A formal dissection of the volar plate was  accomplished followed by volar capsulotomy and release of the volar plate.  With release of multiple scar contractures along the lateral fascial sheet of the radial aspect of the finger, we are able to ultimately improve the PIP flexion contracture from 95 degrees to approximately 5 degrees.  With gentle extension, we continued to release portion of the flexor sheath between the remnants of the A3 pulley and the C2 pulley to allow near full extension of the finger.  There did appear to be chronic bowstringing of both the superficialis and profundus tendons as a consequence of the flexor sheath incompetence.  This will be problematic postoperatively.  After completion of the dissection, we performed a VY advancement flaps and formally neurolysed the radial proper digital nerve, which was densely adherent to the flexor sheath.  The skin wounds were repaired with corner sutures of 5-0 nylon followed by release of the tourniquet with a finger at 20 degrees flexion at the PIP joint and approximately 10 degrees flexion at the DIP joint.  There was immediate capillary refill to the thumb, index, long, and ring fingers and slow capillary refill to the small finger.  Turgor was never quite as robust as the adjacent digits but within 2 minutes after a 2% lidocaine digital block at metacarpal head level, we obtained a 2 second capillary refill.  The closure was completed with interrupted 5-0 nylon suture followed by dressing of the wound with Adaptic sterile gauze, sterile Kerlix, sterile Webril, and a volar plaster splint maintaining the MP joint in full extension, PIP joint at 30 degrees of flexion, and the DIP joint at 20 degrees flexion.  In the dressing, adequate turgor and capillary refill remained.  There are no apparent complications.  This continues to be a highly jeopardized finger.  For aftercare, we will discharge Bonnie Johnson with prescriptions for Dilaudid 2 mg 1-2 tablets p.o.  q.4-6 hours p.r.n. pain and we will follow her in the office in 6 days for dressing change and initiation of postoperative exercise program which will include immediate active range of motion exercises.     Bonnie Johnson, M.D.     RVS/MEDQ  D:  10/08/2011  T:  10/08/2011  Job:  [id number]

## 2011-10-08 NOTE — Discharge Instructions (Signed)

## 2011-10-08 NOTE — Op Note (Signed)
[id number] 

## 2011-10-08 NOTE — Transfer of Care (Signed)
Immediate Anesthesia Transfer of Care Note  Patient: Bonnie Johnson  Procedure(s) Performed: Procedure(s) (LRB): DUPUYTREN CONTRACTURE RELEASE (Left)  Patient Location: PACU  Anesthesia Type: General  Level of Consciousness: sedated and patient cooperative  Airway & Oxygen Therapy: Patient Spontanous Breathing and Patient connected to face mask oxygen  Post-op Assessment: Report given to PACU RN and Post -op Vital signs reviewed and stable  Post vital signs: Reviewed and stable  Complications: No apparent anesthesia complications

## 2011-10-08 NOTE — Anesthesia Procedure Notes (Signed)
Procedure Name: LMA Insertion Date/Time: 10/08/2011 8:01 AM Performed by: Gar Gibbon Pre-anesthesia Checklist: Patient identified, Emergency Drugs available, Suction available and Patient being monitored Patient Re-evaluated:Patient Re-evaluated prior to inductionOxygen Delivery Method: Circle System Utilized Preoxygenation: Pre-oxygenation with 100% oxygen Intubation Type: IV induction Ventilation: Mask ventilation without difficulty LMA: LMA inserted LMA Size: 3.0 Number of attempts: 1 Airway Equipment and Method: bite block Placement Confirmation: positive ETCO2 Tube secured with: Tape Dental Injury: Teeth and Oropharynx as per pre-operative assessment

## 2011-10-08 NOTE — Anesthesia Preprocedure Evaluation (Signed)
Anesthesia Evaluation  Patient identified by MRN, date of birth, ID band Patient awake    Reviewed: Allergy & Precautions, H&P , NPO status , Patient's Chart, lab work & pertinent test results  History of Anesthesia Complications (+) PONV  Airway Mallampati: II  Neck ROM: full    Dental   Pulmonary          Cardiovascular hypertension,     Neuro/Psych    GI/Hepatic   Endo/Other  Hypothyroidism obese  Renal/GU      Musculoskeletal   Abdominal   Peds  Hematology   Anesthesia Other Findings   Reproductive/Obstetrics                           Anesthesia Physical Anesthesia Plan  ASA: II  Anesthesia Plan: General   Post-op Pain Management:    Induction: Intravenous  Airway Management Planned: LMA  Additional Equipment:   Intra-op Plan:   Post-operative Plan:   Informed Consent: I have reviewed the patients History and Physical, chart, labs and discussed the procedure including the risks, benefits and alternatives for the proposed anesthesia with the patient or authorized representative who has indicated his/her understanding and acceptance.     Plan Discussed with: CRNA and Surgeon  Anesthesia Plan Comments:         Anesthesia Quick Evaluation

## 2011-10-08 NOTE — Brief Op Note (Signed)
10/08/2011  9:17 AM  PATIENT:  Gwenith Daily  56 y.o. female  PRE-OPERATIVE DIAGNOSIS:  dupuytrens contracture,  severe flexor tenodesis status post flexor laceration  POST-OPERATIVE DIAGNOSIS:  dupuytrens contracture, severe flexor tenodesis status post flexor laceration scarred volar plate  PROCEDURE: :DUPUYTREN CONTRACTURE RELEASE LEFT SMALL FINGER, TENOLYSIS FLEXOR SUPERFICIALIS , TENOLYSIS OF FLEXOR PROFUNDUS, PIP CAPSULE RELEASE AND NEUROLYSIS OF RADIAL PROPER DIGITAL NERVE   SURGEON:     * Wyn Forster., MD - Primary  PHYSICIAN ASSISTANT:   ASSISTANTS: Mallory Shirk.A-C    ANESTHESIA:   general  EBL:     BLOOD ADMINISTERED:none  DRAINS: none   LOCAL MEDICATIONS USED:  LIDOCAINE   SPECIMEN:  No Specimen  DISPOSITION OF SPECIMEN:  N/A  COUNTS:  YES  TOURNIQUET:   Total Tourniquet Time Documented: Upper Arm (Left) - 57 minutes  DICTATION: .Other Dictation: Dictation Number [id number]  PLAN OF CARE: Discharge to home after PACU  PATIENT DISPOSITION:  PACU - hemodynamically stable.

## 2011-10-08 NOTE — Anesthesia Postprocedure Evaluation (Signed)
Anesthesia Post Note  Patient: Bonnie Johnson  Procedure(s) Performed: Procedure(s) (LRB): DUPUYTREN CONTRACTURE RELEASE (Left)  Anesthesia type: General  Patient location: PACU  Post pain: Pain level controlled and Adequate analgesia  Post assessment: Post-op Vital signs reviewed, Patient's Cardiovascular Status Stable, Respiratory Function Stable, Patent Airway and Pain level controlled  Last Vitals:  Filed Vitals:   10/08/11 0930  BP: 119/67  Pulse: 101  Temp:   Resp: 15    Post vital signs: Reviewed and stable  Level of consciousness: awake, alert  and oriented  Complications: No apparent anesthesia complications

## 2011-10-09 ENCOUNTER — Encounter (HOSPITAL_BASED_OUTPATIENT_CLINIC_OR_DEPARTMENT_OTHER): Payer: Self-pay | Admitting: Orthopedic Surgery

## 2014-02-23 DIAGNOSIS — H905 Unspecified sensorineural hearing loss: Secondary | ICD-10-CM | POA: Insufficient documentation

## 2014-02-23 DIAGNOSIS — E039 Hypothyroidism, unspecified: Secondary | ICD-10-CM | POA: Insufficient documentation

## 2014-02-23 DIAGNOSIS — I10 Essential (primary) hypertension: Secondary | ICD-10-CM | POA: Insufficient documentation

## 2014-02-23 DIAGNOSIS — I7300 Raynaud's syndrome without gangrene: Secondary | ICD-10-CM | POA: Insufficient documentation

## 2016-06-07 DIAGNOSIS — E039 Hypothyroidism, unspecified: Secondary | ICD-10-CM | POA: Diagnosis not present

## 2016-07-10 DIAGNOSIS — M65312 Trigger thumb, left thumb: Secondary | ICD-10-CM | POA: Diagnosis not present

## 2016-08-04 ENCOUNTER — Encounter (HOSPITAL_COMMUNITY): Payer: Self-pay | Admitting: Emergency Medicine

## 2016-08-04 ENCOUNTER — Emergency Department (HOSPITAL_COMMUNITY)
Admission: EM | Admit: 2016-08-04 | Discharge: 2016-08-04 | Disposition: A | Discharge status: Home / Self Care | Payer: 59 | Attending: Emergency Medicine | Admitting: Emergency Medicine

## 2016-08-04 ENCOUNTER — Emergency Department (HOSPITAL_COMMUNITY): Payer: 59

## 2016-08-04 DIAGNOSIS — I10 Essential (primary) hypertension: Secondary | ICD-10-CM | POA: Insufficient documentation

## 2016-08-04 DIAGNOSIS — N1330 Unspecified hydronephrosis: Secondary | ICD-10-CM | POA: Diagnosis not present

## 2016-08-04 DIAGNOSIS — E039 Hypothyroidism, unspecified: Secondary | ICD-10-CM | POA: Insufficient documentation

## 2016-08-04 DIAGNOSIS — R109 Unspecified abdominal pain: Secondary | ICD-10-CM

## 2016-08-04 DIAGNOSIS — N3001 Acute cystitis with hematuria: Secondary | ICD-10-CM | POA: Diagnosis not present

## 2016-08-04 DIAGNOSIS — Z79899 Other long term (current) drug therapy: Secondary | ICD-10-CM | POA: Insufficient documentation

## 2016-08-04 DIAGNOSIS — R1031 Right lower quadrant pain: Secondary | ICD-10-CM | POA: Diagnosis not present

## 2016-08-04 LAB — URINALYSIS, ROUTINE W REFLEX MICROSCOPIC
Bilirubin Urine: NEGATIVE
Glucose, UA: NEGATIVE mg/dL
Ketones, ur: NEGATIVE mg/dL
Nitrite: NEGATIVE
PROTEIN: NEGATIVE mg/dL
SPECIFIC GRAVITY, URINE: 1.002 — AB (ref 1.005–1.030)
pH: 7 (ref 5.0–8.0)

## 2016-08-04 LAB — CBC WITH DIFFERENTIAL/PLATELET
Basophils Absolute: 0 10*3/uL (ref 0.0–0.1)
Basophils Relative: 0 %
EOS ABS: 0 10*3/uL (ref 0.0–0.7)
EOS PCT: 0 %
HCT: 36.4 % (ref 36.0–46.0)
HEMOGLOBIN: 11.9 g/dL — AB (ref 12.0–15.0)
LYMPHS ABS: 1.8 10*3/uL (ref 0.7–4.0)
Lymphocytes Relative: 17 %
MCH: 28.6 pg (ref 26.0–34.0)
MCHC: 32.7 g/dL (ref 30.0–36.0)
MCV: 87.5 fL (ref 78.0–100.0)
MONO ABS: 1.1 10*3/uL — AB (ref 0.1–1.0)
MONOS PCT: 11 %
NEUTROS PCT: 72 %
Neutro Abs: 7.6 10*3/uL (ref 1.7–7.7)
Platelets: 294 10*3/uL (ref 150–400)
RBC: 4.16 MIL/uL (ref 3.87–5.11)
RDW: 13.6 % (ref 11.5–15.5)
WBC: 10.6 10*3/uL — ABNORMAL HIGH (ref 4.0–10.5)

## 2016-08-04 LAB — COMPREHENSIVE METABOLIC PANEL
ALK PHOS: 76 U/L (ref 38–126)
ALT: 9 U/L — ABNORMAL LOW (ref 14–54)
ANION GAP: 9 (ref 5–15)
AST: 13 U/L — ABNORMAL LOW (ref 15–41)
Albumin: 3.2 g/dL — ABNORMAL LOW (ref 3.5–5.0)
BUN: 20 mg/dL (ref 6–20)
CO2: 26 mmol/L (ref 22–32)
CREATININE: 0.84 mg/dL (ref 0.44–1.00)
Calcium: 8.5 mg/dL — ABNORMAL LOW (ref 8.9–10.3)
Chloride: 104 mmol/L (ref 101–111)
GLUCOSE: 111 mg/dL — AB (ref 65–99)
Potassium: 3.1 mmol/L — ABNORMAL LOW (ref 3.5–5.1)
SODIUM: 139 mmol/L (ref 135–145)
Total Bilirubin: 0.4 mg/dL (ref 0.3–1.2)
Total Protein: 6.6 g/dL (ref 6.5–8.1)

## 2016-08-04 LAB — PROTIME-INR
INR: 1.07
PROTHROMBIN TIME: 13.9 s (ref 11.4–15.2)

## 2016-08-04 MED ORDER — SODIUM CHLORIDE 0.9 % IV BOLUS (SEPSIS)
500.0000 mL | Freq: Once | INTRAVENOUS | Status: AC
  Administered 2016-08-04: 500 mL via INTRAVENOUS

## 2016-08-04 MED ORDER — SODIUM CHLORIDE 0.9 % IV BOLUS (SEPSIS)
1000.0000 mL | Freq: Once | INTRAVENOUS | Status: AC
  Administered 2016-08-04: 1000 mL via INTRAVENOUS

## 2016-08-04 NOTE — ED Provider Notes (Signed)
[facility]-EMERGENCY DEPT Provider Note   CSN: [id number] Arrival date & time: 08/04/16  0957     History   Chief Complaint No chief complaint on file.   HPI Bonnie Johnson is a 61 y.o. female.  HPI 61 year old female history hypertension hypothyroidism presents today complaining of right flank pain that began 2 days ago. She reports that several weeks ago she had symptoms consistent with urinary tract infection including pain with urination and frequency of urination. She used over-the-counter treatments and felt like that was improved. She began having flulike symptoms began last Saturday and during the week this week she had fever, myalgias, which began resolving on Friday. However, on Friday she began having some right flank pain. This has continued and has been moderate. It is worse when she moves around. She states that she currently is having any pain at rest. She was seen at a urgent care today and had a urinalysis obtained which was positive for blood. She was instructed to come here for evaluation for kidney stone or appendicitis. Past Medical History:  Diagnosis Date  . Hypertension   . Hypothyroidism   . PONV (postoperative nausea and vomiting)   . Thyroid disease     There are no active problems to display for this patient.   Past Surgical History:  Procedure Laterality Date  . CHOLECYSTECTOMY     2012  . DUPUYTREN CONTRACTURE RELEASE  10/08/2011   Procedure: DUPUYTREN CONTRACTURE RELEASE;  Surgeon: Wyn Forster., MD;  Location: [facility];  Service: Orthopedics;  Laterality: Left;  excision left palma fascia, tenolysis of flexors  . FOOT SURGERY  Prior to 1982   3 surgeries to remove cyst  . surgery on left hand     sept 24th 2012, severe pinky finger on can lid  . THYROIDECTOMY, PARTIAL  1982    OB History    No data available       Home Medications    Prior to Admission medications   Medication Sig Start Date End Date Taking?  Authorizing Provider  candesartan (ATACAND) 8 MG tablet Take 8 mg by mouth daily.     Historical Provider, MD  felodipine (PLENDIL) 5 MG 24 hr tablet Take 5 mg by mouth daily.      Historical Provider, MD  levothyroxine (SYNTHROID, LEVOTHROID) 125 MCG tablet Take 125 mcg by mouth daily.      Historical Provider, MD    Family History Family History  Problem Relation Age of Onset  . Heart disease Father     Social History Social History  Substance Use Topics  . Smoking status: Never Smoker  . Smokeless tobacco: Never Used  . Alcohol use Yes     Comment: Ocassional     Allergies   Avelox [moxifloxacin hcl in nacl]; Codeine; Doxycycline; Lisinopril; Sulfa antibiotics; and Penicillins   Review of Systems Review of Systems  All other systems reviewed and are negative.    Physical Exam Updated Vital Signs There were no vitals taken for this visit.  Physical Exam  Constitutional: She is oriented to person, place, and time. She appears well-developed and well-nourished. No distress.  HENT:  Head: Normocephalic and atraumatic.  Right Ear: External ear normal.  Left Ear: External ear normal.  Nose: Nose normal.  Eyes: Conjunctivae and EOM are normal. Pupils are equal, round, and reactive to light.  Neck: Normal range of motion. Neck supple.  Pulmonary/Chest: Effort normal and breath sounds normal.  Abdominal: Soft. Bowel sounds  are normal. There is tenderness.  Tenderness palpation right lower quadrant Right CVA tenderness  Musculoskeletal: Normal range of motion.  Neurological: She is alert and oriented to person, place, and time. She exhibits normal muscle tone. Coordination normal.  Skin: Skin is warm and dry.  Psychiatric: She has a normal mood and affect. Her behavior is normal. Thought content normal.  Nursing note and vitals reviewed.    ED Treatments / Results  Labs (all labs ordered are listed, but only abnormal results are displayed) Labs Reviewed - No data  to display  EKG  EKG Interpretation None       Radiology Ct Renal Stone Study  Result Date: 08/04/2016 CLINICAL DATA:  Right-sided abdominal pain for several days. Hematuria. EXAM: CT ABDOMEN AND PELVIS WITHOUT CONTRAST TECHNIQUE: Multidetector CT imaging of the abdomen and pelvis was performed following the standard protocol without IV contrast. COMPARISON:  07/16/2010 FINDINGS: Lower chest: Unremarkable Hepatobiliary: Multiple fluid density hepatic lesions as shown on the prior CT scan. Today contrast was not given to assess for enhancement but the visualized lesions are reasonably fluid density. Cholecystectomy. The right hepatic lobe appears somewhat larger than on the prior exam. Pancreas: Unremarkable Spleen: Unremarkable Adrenals/Urinary Tract: There has been significant the rotation of the right kidney to assume a long axis in the axial plane as shown on image 44/3, previously the long axis was closed a vertical. I ascribed this rotation at least partially to enlargement of the right hepatic lobe compared to the prior exam, which can be due to hepatic congestion. There is right perirenal stranding as well as mild right hydronephrosis without visible hydroureter. It is possible that twisting along the pedicle of the kidney is causing this low-grade partial obstruction at the UPJ. No stones identified. Urinary bladder unremarkable. Adrenal glands unremarkable Stomach/Bowel: Unremarkable.  Normal appendix. Vascular/Lymphatic: Unremarkable Reproductive: Unremarkable Other: No supplemental non-categorized findings. Musculoskeletal: Lumbar degenerative disc disease observed, possible left foraminal impingement at the L3-4 and L4-5 levels. IMPRESSION: 1. There has been significant abnormal change in the orientation of the right kidney, which now has its long axis in the axial plane, associated with mild right hydronephrosis and right perirenal stranding. I suspect that this is a case of transient  rotation of the right kidney related to enlargement of the right hepatic lobe, causing partial ureteral obstruction at the right UPJ due to twisting. I do not see any renal calculi. This phenomenon of renal rotation in response to hepatic enlargement has been described in the literature, for example: Transient Rotation of a Non-ptotic Kidney Secondary to Acute Pulmonary Thromboembolism. Iman Khodarahmi and Charlesetta Garibaldi. J Clin Imaging Sci. 2014; 4: 69. The cause of increase in size of the right hepatic lobe on today's exam is not immediately apparent, but may be due to hepatic congestion; the patient does have multiple bilateral hepatic cysts which were also present previously. 2. Lumbar degenerative disc disease likely causing left foraminal impingement at L3-4 and L4-5. Electronically Signed   By: Gaylyn Rong M.D.   On: 08/04/2016 12:02    Procedures Procedures (including critical care time)  Medications Ordered in ED Medications - No data to display   Initial Impression / Assessment and Plan / ED Course  I have reviewed the triage vital signs and the nursing notes.  Pertinent labs & imaging results that were available during my care of the patient were reviewed by me and considered in my medical decision making (see chart for details).   discussed CT results  with Dr. Laverle Patter, on-call for urology. 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 Patient has received IV normal saline here. Urine has 0-5 white blood cells and 0-5 red blood cells with patient without frequency or pain with urination. Plan pain control after hydration. She will call urology office tomorrow for follow-up this week.    Final Clinical Impressions(s) / ED Diagnoses   Final diagnoses:  Hydronephrosis of right kidney  Right flank pain    New Prescriptions Discharge Medication List as of 08/04/2016  1:12 PM         Margarita Grizzle, MD 08/04/16 1556

## 2016-08-04 NOTE — Discharge Instructions (Signed)
Drink plenty of fluids. Call Dr. Vevelyn Royals office tomorrow for recheck this week.   Use tylenol as needed for pain. Return if worse at any time including worsening pain or unable to tolerate medicine by mouth.

## 2016-08-04 NOTE — ED Triage Notes (Signed)
Patient presents today with complaints of RUQ pain radiating to flank. Patient Tender on palpation. Patient denies any N/V Endorse Fever and Chills. Patient has CVA tenderness. Patient reports pain intermitted.

## 2016-08-06 ENCOUNTER — Other Ambulatory Visit: Payer: Self-pay | Admitting: Urology

## 2016-08-06 DIAGNOSIS — N10 Acute tubulo-interstitial nephritis: Secondary | ICD-10-CM | POA: Diagnosis not present

## 2016-08-06 DIAGNOSIS — R160 Hepatomegaly, not elsewhere classified: Secondary | ICD-10-CM | POA: Diagnosis not present

## 2016-08-06 DIAGNOSIS — N134 Hydroureter: Secondary | ICD-10-CM

## 2016-08-06 LAB — URINE CULTURE: Culture: 50000 — AB

## 2016-08-07 ENCOUNTER — Telehealth: Payer: Self-pay | Admitting: *Deleted

## 2016-08-07 ENCOUNTER — Telehealth: Payer: Self-pay | Admitting: Emergency Medicine

## 2016-08-07 NOTE — Telephone Encounter (Signed)
Pt called stating she was given Rx for another pt at discharge and asked if she could come and pick up hers.  EDCM reviewed AVS and did not find that an Rx was written for pt. Notified pt of findings.  Pt thought she remembered MD telling her that she would receive pain Rx; [facility] reviewed chart again and did not find Rx.  Advised pt to visit PCP if she is in need of pain relief of Urgent Care if PCP not available.  Pt appreciative.

## 2016-08-07 NOTE — Telephone Encounter (Signed)
Post ED Visit - Positive Culture Follow-up  Culture report reviewed by antimicrobial stewardship pharmacist:   Enzo Bi, Pharm.D.  Celedonio Miyamoto, Pharm.D., BCPS AQ-ID  Garvin Fila, Pharm.D., BCPS  Georgina Pillion, Pharm.D., BCPS  [place], [place].D., BCPS, AAHIVP  Estella Husk, Pharm.D., BCPS, AAHIVP  Lysle Pearl, PharmD, BCPS  Casilda Carls, PharmD, BCPS  Pollyann Samples, PharmD, BCPS Dietrich Pates PharmD  Positive urine culture Treated with none, to followup with urology,  no further patient follow-up is required at this time.  Berle Mull 08/07/2016, 12:33 PM

## 2016-08-08 ENCOUNTER — Encounter: Payer: Self-pay | Admitting: Physician Assistant

## 2016-08-15 ENCOUNTER — Ambulatory Visit (INDEPENDENT_AMBULATORY_CARE_PROVIDER_SITE_OTHER): Payer: 59 | Admitting: Physician Assistant

## 2016-08-15 ENCOUNTER — Encounter: Payer: Self-pay | Admitting: Physician Assistant

## 2016-08-15 VITALS — BP 136/68 | HR 117 | Ht 63.5 in | Wt 126.0 lb

## 2016-08-15 DIAGNOSIS — N1339 Other hydronephrosis: Secondary | ICD-10-CM

## 2016-08-15 DIAGNOSIS — R160 Hepatomegaly, not elsewhere classified: Secondary | ICD-10-CM | POA: Diagnosis not present

## 2016-08-15 MED ORDER — ONDANSETRON 4 MG PO TBDP: 4.0000 mg | ORAL_TABLET | ORAL | 0 refills | Status: AC | PRN

## 2016-08-15 MED ORDER — TRAMADOL HCL 50 MG PO TABS: 50.0000 mg | ORAL_TABLET | Freq: Four times a day (QID) | ORAL | 0 refills | Status: AC | PRN

## 2016-08-15 NOTE — Progress Notes (Addendum)
Chief Complaint: Hepatomegaly  HPI:  [name] is a 61 year old Caucasian female with a past medical history as listed below, who was referred to me by Piedad Climes, [place] E, * for a complaint of new finding of hepatomegaly .     Patient seen in the ER on 08/04/16 with CT showing significant abnormal change and orientation of the right kidney with mild right hydronephrosis and right perirenal stranding. It was suspected that this was a case of transient rotation of the right kidney related to enlargement of the right hepatic lobe causing partial ureteral obstruction at the right UPJ due to twisting. There was no renal calculi. It was thought that the increase in size of the right hepatic lobe was not immediately apparent, though thought may be due to hepatic congestion. Patient was also noted to have multiple bilateral hepatic cysts. Urine culture at that time was positive for Escherichia coli. Urinalysis was positive for leukocytes and blood. CMP was normal. CBC showed a minimally elevated white count at 10.6.   Today, it should be noted that the patient is a wonderful historian. She tells me that about 2-3 weeks ago she had urinary frequency and some pain and thought that she may be a UTI, she took over-the-counter remedies and seemed to get better from this. Then on Saturday, April 7 she developed a right-sided pain which radiated through into her back, this was quite severe in nature but by the next day she felt "okay" and loaded at least "20 bags of mulch" onto her garden, that next Monday she felt "lousy", she was "achy all over and felt like I had the flu, was running a low-grade fever", this kept her laying in bed over that week and on Saturday she finally went out again but developed severe back pain again on the right side. She proceeded to the urgent care on Sunday morning, 08/11/16, and was found to have blood in her urine and sent to the ER for a CT scan. This was as above. She then saw the  urologist last week who believed that her liver could be compressing her kidney and causing a twisting of her ureter. She was then sent to our office for further evaluation of her enlarged liver.     The patient explains that she has been continuing with some right upper quadrant pain which radiates through to her back, some days this is more severe than others. Today this is a 1-2/0. It comes and goes. She has been using Ibuprofen 600 mg daily over the past 2 weeks to help with this and has noticed a slight increase in gas and bloating. She tells me she is scheduled for renogram Monday and has an appointment with urology again at the end of the month. Patient also describes occasional nausea when the pain is severe and has been using her at home supply of Zofran 4.mg prn.   Patient's social history is positive for being about 75 pounds heavier back in 2010/11 when she went through a divorce she lost all this weight dramatically.   Patient denies change in bowel habits, weight loss, vomiting, heartburn, GERD, family history of liver disease, history of hepatitis, blood transfusions or IV drug use.    Past Medical History:  Diagnosis Date  . Gallstones   . Hypertension   . Hypothyroidism   . PONV (postoperative nausea and vomiting)   . Thyroid disease     Past Surgical History:  Procedure Laterality Date  . CHOLECYSTECTOMY  2012  . 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 FOOT SURGERY  Prior to 1982   3 surgeries to remove cyst  . surgery on left hand     sept 24th 2012, severe pinky finger on can lid  . THYROIDECTOMY, PARTIAL  1982    Current Outpatient Prescriptions  Medication Sig Dispense Refill  . felodipine (PLENDIL) 5 MG 24 hr tablet Take 5 mg by mouth daily.      Marland Kitchen  losartan (COZAAR) 50 MG tablet Take 50 mg by mouth daily.    . meloxicam (MOBIC) 7.5 MG tablet Take 7.5 mg by mouth daily.    Marland Kitchen SYNTHROID 88 MCG tablet Take 88 mcg by mouth daily.    . ondansetron (ZOFRAN ODT) 4 MG disintegrating tablet Take 1 tablet (4 mg total) by mouth every 4 (four) hours as needed for nausea or vomiting. 30 tablet 0  . traMADol (ULTRAM) 50 MG tablet Take 1 tablet (50 mg total) by mouth every 6 (six) hours as needed. 15 tablet 0   No current facility-administered medications for this visit.     Allergies as of 08/15/2016 - Review Complete 08/15/2016  Allergen Reaction Noted  . Avelox [moxifloxacin hcl in nacl]  08/29/2010  . Codeine  08/29/2010  . Doxycycline Hives 10/02/2011  . Lisinopril  08/29/2010  . Sulfa antibiotics  08/29/2010  . Penicillins Rash 08/29/2010    Family History  Problem Relation Age of Onset  . Colon cancer Mother   . Heart disease Father     Social History   Social History  . Marital status: Divorced    Spouse name: N/A  . Number of children: 1  . Years of education: N/A   Occupational History  . retired    Social History Main Topics  . Smoking status: Never Smoker  . Smokeless tobacco: Never Used  . Alcohol use 0.6 - 1.2 oz/week    1 - 2 Glasses of wine per week     Comment: once a week  . Drug use: No  . Sexual activity: No   Other Topics Concern  . Not on file   Social History Narrative  . No narrative on file    Review of Systems:    Constitutional: No weight loss, fever, chills, weakness or fatigue Skin: No rash Cardiovascular: No chest pain  Respiratory: No SOB Gastrointestinal: See HPI and otherwise negative Genitourinary: No dysuria  Neurological: No headache Musculoskeletal: No new muscle or joint pain Hematologic: No bleeding or bruising Psychiatric: No history of depression or anxiety   Physical Exam:  Vital signs: BP 136/68   Pulse (!) 117   Ht 5' 3.5" (1.613 m) Comment: height measured without  shoes  Wt 126 lb (57.2 kg)   BMI 21.97 kg/m   Constitutional:   Pleasant Caucasian female appears to be in NAD, Well developed, Well nourished, alert and cooperative Head:  Normocephalic and atraumatic. Eyes:   PEERL, EOMI. No icterus. Conjunctiva pink. Ears:  Normal auditory acuity. Neck:  Supple Throat: Oral cavity and pharynx without inflammation, swelling or lesion.  Respiratory: Respirations even and unlabored. Lungs clear to auscultation bilaterally.   No wheezes, crackles, or rhonchi.  Cardiovascular: Normal S1, S2. No MRG. Regular rate and rhythm. No peripheral edema, cyanosis or pallor.  Gastrointestinal:  Soft, nondistended,  Moderate tenderness RUQ No rebound or guarding. Normal bowel sounds. No appreciable masses. Liver edge appreciated only with deep expiration. Rectal:  Not performed.  Msk:  Symmetrical without gross deformities. Without edema, no deformity or joint abnormality.  Neurologic:  Alert and  oriented x4;  grossly normal neurologically.  Skin:   Dry and intact without significant lesions or rashes. Psychiatric:  Demonstrates good judgement and reason without abnormal affect or behaviors.  RELEVANT LABS AND IMAGING: CBC    Component Value Date/Time   WBC 10.6 (H) 08/04/2016 1034   RBC 4.16 08/04/2016 1034   HGB 11.9 (L) 08/04/2016 1034   HCT 36.4 08/04/2016 1034   PLT 294 08/04/2016 1034   MCV 87.5 08/04/2016 1034   MCH 28.6 08/04/2016 1034   MCHC 32.7 08/04/2016 1034   RDW 13.6 08/04/2016 1034   LYMPHSABS 1.8 08/04/2016 1034   MONOABS 1.1 (H) 08/04/2016 1034   EOSABS 0.0 08/04/2016 1034   BASOSABS 0.0 08/04/2016 1034    CMP     Component Value Date/Time   NA 139 08/04/2016 1034   K 3.1 (L) 08/04/2016 1034   CL 104 08/04/2016 1034   CO2 26 08/04/2016 1034   GLUCOSE 111 (H) 08/04/2016 1034   BUN 20 08/04/2016 1034   CREATININE 0.84 08/04/2016 1034   CALCIUM 8.5 (L) 08/04/2016 1034   PROT 6.6 08/04/2016 1034   ALBUMIN 3.2 (L) 08/04/2016 1034    AST 13 (L) 08/04/2016 1034   ALT 9 (L) 08/04/2016 1034   ALKPHOS 76 08/04/2016 1034   BILITOT 0.4 08/04/2016 1034   GFRNONAA >60 08/04/2016 1034   GFRAA >60 08/04/2016 1034   EXAM: CT ABDOMEN AND PELVIS WITHOUT CONTRAST 08/04/16  TECHNIQUE: Multidetector CT imaging of the abdomen and pelvis was performed following the standard protocol without IV contrast.  COMPARISON:  07/16/2010  FINDINGS: Lower chest: Unremarkable  Hepatobiliary: Multiple fluid density hepatic lesions as shown on the prior CT scan. Today contrast was not given to assess for enhancement but the visualized lesions are reasonably fluid density.  Cholecystectomy. The right hepatic lobe appears somewhat larger than on the prior exam.  Pancreas: Unremarkable  Spleen: Unremarkable  Adrenals/Urinary Tract: There has been significant the rotation of the right kidney to assume a long axis in the axial plane as shown on image 44/3, previously the long axis was closed a vertical. I ascribed this rotation at least partially to enlargement of the right hepatic lobe compared to the prior exam, which can be due to hepatic congestion. There is right perirenal stranding as well as mild right hydronephrosis without visible hydroureter. It is possible that twisting along the pedicle of the kidney is causing this low-grade partial obstruction at the UPJ.  No stones identified. Urinary bladder unremarkable. Adrenal glands unremarkable  Stomach/Bowel: Unremarkable.  Normal appendix.  Vascular/Lymphatic: Unremarkable  Reproductive: Unremarkable  Other: No supplemental non-categorized findings.  Musculoskeletal: Lumbar degenerative disc disease observed, possible left foraminal impingement at the L3-4 and L4-5 levels.  IMPRESSION: 1. There has been significant abnormal change in the orientation of the right kidney, which now has its long axis in the axial plane, associated with mild right  hydronephrosis and right perirenal stranding. I suspect that this is a case of transient rotation of the right kidney related to enlargement of the right hepatic lobe, causing partial ureteral obstruction at the right UPJ due to twisting. I do not see any renal calculi. This phenomenon of renal rotation in response to hepatic enlargement has been described in  the literature, for example: Transient Rotation of a Non-ptotic Kidney Secondary to Acute Pulmonary Thromboembolism. Iman Khodarahmi and Charlesetta Garibaldi. J Clin Imaging Sci. 2014; 4: 69. The cause of increase in size of the right hepatic lobe on today's exam is not immediately apparent, but may be due to hepatic congestion; the patient does have multiple bilateral hepatic cysts which were also present previously. 2. Lumbar degenerative disc disease likely causing left foraminal impingement at L3-4 and L4-5.   Electronically Signed   By: Gaylyn Rong M.D.   On: 08/04/2016 12:02  Assessment: 1. Hepatomegaly: Seen at time of recent CT above, upon review of CT in 2012, patient's liver was somewhat enlarged then with finding of 2 small cysts, it has "gotten bigger", from previous exam according to radiologist, liver enzymes are normal, no significant liver history per patient, discussed this case with Dr. Myrtie Neither, we do not believe there is any underlying liver pathology to explain patient's hepatomegaly, likely this is a benign finding which is now complicating patient's kidney pathology 2. Renal rotation/hydronephrosis: May be related to above  Plan: 1. No further workup is recommended regarding patient's hepatomegaly. There are no underlying signs of liver disease, autoimmune disease or congestion via heart failure or other. It is our suggestion that if patient does need surgery in the future that may be complicated due to enlarged liver, that a liver specialist is involved with the case. Dr. Donell Beers is the surgeon in town who  would need to be consulted in regards to this. 2. Discussed all this with the patient. She is ready  to move forward with her kidney workup as she has a trip planned in a month to [place]. 3. Did prescribe Tramadol 50 mg every 4-6 hours #15 with no refills. The patient needs a further pain medication she can contact her urologist/nephrologist 4. Prescribed Zofran 4 mg every 4-6 hours for nausea #30 with no refills, again if she needs this medication in the future she can contact her urologist 5. Please let us know if we may be of any further help, patient may follow in our clinic as needed in the future with either Dr. Myrtie Neither or myself  Hyacinth Meeker, PA-C [facility] 08/15/2016, 1:01 PM  Cc: Burnis Medin, *    Thank you for sending this case to me and for having me see patient with you in clinic today. I have reviewed the entire note, and the outlined plan is what we discussed.  There does not appear to be a pathologic cause of the hepatomegaly, and we have no particular therapy to decrease liver size.  Unclear how/if hepatomegaly contributing to the renal pathology.  Will suggest to urology to proceed with their planned therapy to relieve obstruction.  If needs open operative management, I suggest involving hepatobiliary surgery in case assistance needed with liver.   Amada Jupiter, MD

## 2016-08-15 NOTE — Patient Instructions (Signed)
We have sent the following medications to your pharmacy for you to pick up at your convenience:  Zofran 4 mg every 4-6 hours as needed for nausea.   Tramadol 50 mg every 4-6 hours as needed for pain.

## 2016-08-19 ENCOUNTER — Encounter (HOSPITAL_COMMUNITY)
Admission: RE | Admit: 2016-08-19 | Discharge: 2016-08-19 | Disposition: A | Discharge status: Home / Self Care | Payer: 59 | Source: Ambulatory Visit | Attending: Urology | Admitting: Urology

## 2016-08-19 DIAGNOSIS — N134 Hydroureter: Secondary | ICD-10-CM | POA: Diagnosis present

## 2016-08-19 DIAGNOSIS — N1330 Unspecified hydronephrosis: Secondary | ICD-10-CM | POA: Diagnosis not present

## 2016-08-19 MED ORDER — FUROSEMIDE 10 MG/ML IJ SOLN
20.0000 mg | Freq: Once | INTRAMUSCULAR | Status: AC
  Administered 2016-08-19: 20 mg via INTRAVENOUS

## 2016-08-19 MED ORDER — TECHNETIUM TC 99M MERTIATIDE
5.1000 | Freq: Once | INTRAVENOUS | Status: AC | PRN
  Administered 2016-08-19: 5.1 via INTRAVENOUS

## 2016-08-19 MED ORDER — FUROSEMIDE 10 MG/ML IJ SOLN
INTRAMUSCULAR | Status: AC
  Filled 2016-08-19: qty 4

## 2016-08-26 DIAGNOSIS — N134 Hydroureter: Secondary | ICD-10-CM | POA: Diagnosis not present

## 2016-08-26 DIAGNOSIS — N10 Acute tubulo-interstitial nephritis: Secondary | ICD-10-CM | POA: Diagnosis not present

## 2016-08-26 DIAGNOSIS — R160 Hepatomegaly, not elsewhere classified: Secondary | ICD-10-CM | POA: Diagnosis not present

## 2016-09-04 DIAGNOSIS — E876 Hypokalemia: Secondary | ICD-10-CM | POA: Diagnosis not present

## 2016-09-04 DIAGNOSIS — R0781 Pleurodynia: Secondary | ICD-10-CM | POA: Diagnosis not present

## 2016-09-04 DIAGNOSIS — S299XXA Unspecified injury of thorax, initial encounter: Secondary | ICD-10-CM | POA: Diagnosis not present

## 2016-09-04 DIAGNOSIS — R1011 Right upper quadrant pain: Secondary | ICD-10-CM | POA: Diagnosis not present

## 2016-09-09 DIAGNOSIS — R935 Abnormal findings on diagnostic imaging of other abdominal regions, including retroperitoneum: Secondary | ICD-10-CM | POA: Diagnosis not present

## 2016-09-09 DIAGNOSIS — R109 Unspecified abdominal pain: Secondary | ICD-10-CM | POA: Diagnosis not present

## 2016-09-09 DIAGNOSIS — R160 Hepatomegaly, not elsewhere classified: Secondary | ICD-10-CM | POA: Diagnosis not present

## 2016-12-06 DIAGNOSIS — E039 Hypothyroidism, unspecified: Secondary | ICD-10-CM | POA: Diagnosis not present

## 2016-12-25 DIAGNOSIS — I10 Essential (primary) hypertension: Secondary | ICD-10-CM | POA: Diagnosis not present

## 2017-04-07 DIAGNOSIS — Z124 Encounter for screening for malignant neoplasm of cervix: Secondary | ICD-10-CM | POA: Diagnosis not present

## 2017-04-07 DIAGNOSIS — Z0000 Encounter for general adult medical examination without abnormal findings: Secondary | ICD-10-CM | POA: Diagnosis not present

## 2017-04-07 DIAGNOSIS — Z1231 Encounter for screening mammogram for malignant neoplasm of breast: Secondary | ICD-10-CM | POA: Diagnosis not present

## 2017-04-18 DIAGNOSIS — Z1231 Encounter for screening mammogram for malignant neoplasm of breast: Secondary | ICD-10-CM | POA: Diagnosis not present

## 2017-05-12 DIAGNOSIS — J039 Acute tonsillitis, unspecified: Secondary | ICD-10-CM | POA: Diagnosis not present

## 2017-05-12 DIAGNOSIS — R509 Fever, unspecified: Secondary | ICD-10-CM | POA: Diagnosis not present

## 2017-05-12 DIAGNOSIS — J329 Chronic sinusitis, unspecified: Secondary | ICD-10-CM | POA: Diagnosis not present

## 2017-06-13 DIAGNOSIS — E039 Hypothyroidism, unspecified: Secondary | ICD-10-CM | POA: Diagnosis not present

## 2017-08-06 DIAGNOSIS — G8929 Other chronic pain: Secondary | ICD-10-CM | POA: Diagnosis not present

## 2017-08-06 DIAGNOSIS — I10 Essential (primary) hypertension: Secondary | ICD-10-CM | POA: Diagnosis not present

## 2017-09-18 DIAGNOSIS — K7689 Other specified diseases of liver: Secondary | ICD-10-CM | POA: Diagnosis not present

## 2017-09-18 DIAGNOSIS — N134 Hydroureter: Secondary | ICD-10-CM | POA: Diagnosis not present

## 2017-09-18 DIAGNOSIS — Q632 Ectopic kidney: Secondary | ICD-10-CM | POA: Diagnosis not present

## 2017-09-22 DIAGNOSIS — N134 Hydroureter: Secondary | ICD-10-CM | POA: Diagnosis not present

## 2017-09-22 DIAGNOSIS — R160 Hepatomegaly, not elsewhere classified: Secondary | ICD-10-CM | POA: Diagnosis not present

## 2018-12-13 IMAGING — NM NM RENAL IMAGING FLOW W/ PHARM
2 series · 12 of 12 positions shown · non-contrast
Comparison: None

Correlation: CT abdomen pelvis 08/04/2016

CLINICAL DATA: Hydroureter if mild positioned RIGHT kidney with
hydronephrosis question urinary tract obstruction

EXAM:
NUCLEAR MEDICINE RENAL SCAN WITH DIURETIC ADMINISTRATION
TECHNIQUE: Radionuclide angiographic and sequential renal images were obtained
after intravenous injection of radiopharmaceutical. Imaging was
continued during slow intravenous injection of Lasix approximately
15 minutes after the start of the examination.
RADIOPHARMACEUTICALS:  5.1 mCi Uechnetium-VVm MAG3 IV
Pharmaceutical:  28.6 mg Lasix IV 20 minutes into the exam

[Series 1: renal scan · 4.14mm/px · 6 of 84 frames shown (1 of 2)]
[frame 8/84]
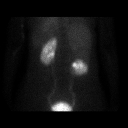
[frame 22/84]
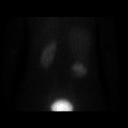
[frame 36/84]
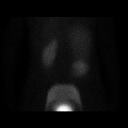
[frame 50/84]
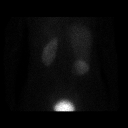
[frame 64/84]
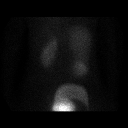
[frame 78/84]
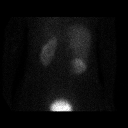

[Series 1: renal scan · 4.14mm/px · 6 of 40 frames shown (2 of 2)]
[frame 4/40  full-range]
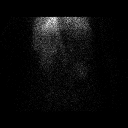
[frame 10/40  full-range]
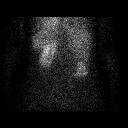
[frame 17/40  full-range]
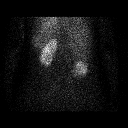
[frame 24/40  full-range]
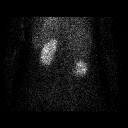
[frame 30/40  full-range]
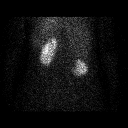
[frame 37/40  full-range]
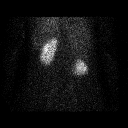

[12 of 12 positions shown; findings below may reference images not displayed]

FINDINGS: Flow: Asymmetry arterial flow to the kidneys. Slightly diminished
upslope of flow to the RIGHT kidney on flow curves versus normal
flow to LEFT kidney. RIGHT kidney appears smaller than LEFT on flow
images though this may be related to abnormal axis/AP orientation of
the RIGHT kidney versus LEFT.

Left renogram: Normal uptake, concentration, and excretion of tracer
by LEFT kidney. Normal time to peak activity of 2.0 minutes with
fall to half maximum 5.3 minutes later

Right renogram: Normal uptake, concentration, and excretion of
tracer by RIGHT kidney. Normal time to peak activity of 3.2 minutes
with fall to half maximum 6.6 minutes later

Differential:

Left kidney = 62 %

Right kidney = 38 %

T1/2  :

Left kidney = 5.3 min

Right kidney = 6.6 min
IMPRESSION: RIGHT kidney appears smaller than LEFT though the RIGHT kidney
appeared normal in size on the preceding CT exam, likely related to
an artifact due to abnormal axis of the RIGHT kidney directed in
more of an anteroposterior plane.

Normal renogram curves and renal function bilaterally without
evidence of urinary outflow obstruction.

## 2020-12-14 DIAGNOSIS — N182 Chronic kidney disease, stage 2 (mild): Secondary | ICD-10-CM | POA: Insufficient documentation

## 2022-05-24 DIAGNOSIS — I340 Nonrheumatic mitral (valve) insufficiency: Secondary | ICD-10-CM | POA: Insufficient documentation

## 2022-06-12 NOTE — Progress Notes (Unsigned)
    Bonnie Johnson D.[place] Art[place] Phone: [phone]   Assessment and Plan:     There are no diagnoses linked to this encounter.  ***   Pertinent previous records reviewed include ***   Follow Up: ***     Subjective:   I, Bonnie Johnson, am serving as a Education administrator for [employer]  Chief Complaint: buttock pain that goes down leg   HPI:   06/13/2022 Patient is 67 year old female complaining of buttock pain that goes down the leg. Patient states   Relevant Historical Information: ***  Additional pertinent review of systems negative.   Current Outpatient Medications:    felodipine (PLENDIL) 5 MG 24 hr tablet, Take 5 mg by mouth daily.  , Disp: , Rfl:    losartan (COZAAR) 50 MG tablet, Take 50 mg by mouth daily., Disp: , Rfl:    meloxicam (MOBIC) 7.5 MG tablet, Take 7.5 mg by mouth daily., Disp: , Rfl:    ondansetron (ZOFRAN ODT) 4 MG disintegrating tablet, Take 1 tablet (4 mg total) by mouth every 4 (four) hours as needed for nausea or vomiting., Disp: 30 tablet, Rfl: 0   SYNTHROID 88 MCG tablet, Take 88 mcg by mouth daily., Disp: , Rfl:    traMADol (ULTRAM) 50 MG tablet, Take 1 tablet (50 mg total) by mouth every 6 (six) hours as needed., Disp: 15 tablet, Rfl: 0   Objective:     There were no vitals filed for this visit.    There is no height or weight on file to calculate BMI.    Physical Exam:    ***   Electronically signed by:  Bonnie Johnson D.[facility] 4:20 PM 06/12/22

## 2022-06-13 ENCOUNTER — Ambulatory Visit: Payer: Medicare Other | Admitting: Sports Medicine

## 2022-06-13 ENCOUNTER — Ambulatory Visit (INDEPENDENT_AMBULATORY_CARE_PROVIDER_SITE_OTHER): Payer: 59

## 2022-06-13 VITALS — HR 95 | Ht 63.0 in | Wt 126.0 lb

## 2022-06-13 DIAGNOSIS — M5431 Sciatica, right side: Secondary | ICD-10-CM

## 2022-06-13 DIAGNOSIS — M545 Low back pain, unspecified: Secondary | ICD-10-CM

## 2022-06-13 MED ORDER — MELOXICAM 15 MG PO TABS: 15.0000 mg | ORAL_TABLET | Freq: Every day | ORAL | 0 refills | Status: DC

## 2022-06-13 NOTE — Patient Instructions (Addendum)
Good to see you - Start meloxicam 15 mg daily x2 weeks.  If still having pain after 2 weeks, complete 3rd-week of meloxicam. May use remaining meloxicam as needed once daily for pain control.  Do not to use additional NSAIDs while taking meloxicam.  May use Tylenol [phone] mg 2 to 3 times a day for breakthrough pain. Glute HEP  4 week follow up

## 2022-07-16 ENCOUNTER — Ambulatory Visit: Payer: 59 | Admitting: Sports Medicine

## 2022-07-30 ENCOUNTER — Ambulatory Visit: Payer: 59 | Admitting: Sports Medicine

## 2022-12-16 ENCOUNTER — Other Ambulatory Visit: Payer: Self-pay | Admitting: Sports Medicine
